# Patient Record
Sex: Female | Born: 1970 | Race: White | Hispanic: No | Marital: Married | Smoking: Never smoker
Health system: Southern US, Community
[De-identification: ages and names within clinical notes are randomized; demographics above are authoritative.]

## PROBLEM LIST (undated history)

## (undated) DIAGNOSIS — I10 Essential (primary) hypertension: Secondary | ICD-10-CM

## (undated) DIAGNOSIS — J45909 Unspecified asthma, uncomplicated: Secondary | ICD-10-CM

---

## 2015-02-23 ENCOUNTER — Emergency Department (HOSPITAL_COMMUNITY): Payer: Self-pay

## 2015-02-23 ENCOUNTER — Encounter (HOSPITAL_COMMUNITY): Payer: Self-pay

## 2015-02-23 ENCOUNTER — Emergency Department (HOSPITAL_COMMUNITY)
Admission: EM | Admit: 2015-02-23 | Discharge: 2015-02-24 | Disposition: A | Discharge status: Home / Self Care | Payer: Self-pay | Attending: Emergency Medicine | Admitting: Emergency Medicine

## 2015-02-23 DIAGNOSIS — W19XXXA Unspecified fall, initial encounter: Secondary | ICD-10-CM

## 2015-02-23 DIAGNOSIS — Y9389 Activity, other specified: Secondary | ICD-10-CM | POA: Insufficient documentation

## 2015-02-23 DIAGNOSIS — Z91040 Latex allergy status: Secondary | ICD-10-CM | POA: Insufficient documentation

## 2015-02-23 DIAGNOSIS — Y998 Other external cause status: Secondary | ICD-10-CM | POA: Insufficient documentation

## 2015-02-23 DIAGNOSIS — S8992XA Unspecified injury of left lower leg, initial encounter: Secondary | ICD-10-CM | POA: Insufficient documentation

## 2015-02-23 DIAGNOSIS — Y92000 Kitchen of unspecified non-institutional (private) residence as  the place of occurrence of the external cause: Secondary | ICD-10-CM | POA: Insufficient documentation

## 2015-02-23 DIAGNOSIS — I10 Essential (primary) hypertension: Secondary | ICD-10-CM | POA: Insufficient documentation

## 2015-02-23 DIAGNOSIS — S0083XA Contusion of other part of head, initial encounter: Secondary | ICD-10-CM | POA: Insufficient documentation

## 2015-02-23 DIAGNOSIS — W01198A Fall on same level from slipping, tripping and stumbling with subsequent striking against other object, initial encounter: Secondary | ICD-10-CM | POA: Insufficient documentation

## 2015-02-23 DIAGNOSIS — M25562 Pain in left knee: Secondary | ICD-10-CM

## 2015-02-23 DIAGNOSIS — J45909 Unspecified asthma, uncomplicated: Secondary | ICD-10-CM | POA: Insufficient documentation

## 2015-02-23 HISTORY — DX: Unspecified asthma, uncomplicated: J45.909

## 2015-02-23 HISTORY — DX: Essential (primary) hypertension: I10

## 2015-02-23 MED ORDER — ACETAMINOPHEN 325 MG PO TABS
650.0000 mg | ORAL_TABLET | Freq: Once | ORAL | Status: AC
  Administered 2015-02-24: 650 mg via ORAL
  Filled 2015-02-23: qty 2

## 2015-02-23 MED ORDER — ACETAMINOPHEN 500 MG PO TABS: 500.0000 mg | ORAL_TABLET | Freq: Four times a day (QID) | ORAL | Status: AC | PRN

## 2015-02-23 MED ORDER — IBUPROFEN 800 MG PO TABS: 800.0000 mg | ORAL_TABLET | Freq: Three times a day (TID) | ORAL | Status: AC

## 2015-02-23 NOTE — Discharge Instructions (Signed)

## 2015-02-23 NOTE — ED Provider Notes (Signed)
CSN: [id number]     Arrival date & time 02/23/15  2156 History  By signing my name below, I, Emmanuella Mensah, attest that this documentation has been prepared under the direction and in the presence of Cheri Fowler, PA-C. Electronically Signed: Angelene Giovanni, ED Scribe. 02/23/2015. 10:54 PM.    Chief Complaint  Patient presents with  . Fall   The history is provided by the patient. No language interpreter was used.   HPI Comments: Cynthia Bass is a 45 y.o. female who presents to the Emergency Department complaining of gradually worsening left knee pain s/p fall that occurred when she tripped over a cartoon of milk as she was carrying a tray of plates. She reports associated nausea and difficulty ambulating secondary to pain. She explains that she fell straight on her knees, hitting her head on shelf in front of her on the way down. She denies any LOC. Pt took 1000 mg Ibuprofen with no relief. She denies any fever, vomiting, visual disturbance, AMS,  numbness/tingling, or weakness.   Past Medical History  Diagnosis Date  . Hypertension   . Asthma    History reviewed. No pertinent past surgical history. No family history on file. Social History  Substance Use Topics  . Smoking status: Never Smoker   . Smokeless tobacco: None  . Alcohol Use: No   OB History    No data available     Review of Systems  Constitutional: Negative for fever.  Gastrointestinal: Positive for nausea. Negative for vomiting.  Musculoskeletal: Positive for arthralgias (left knee).  Neurological: Negative for weakness and numbness.  All other systems reviewed and are negative.     Allergies  Cephalosporins; Latex; Lopurin; and Sulfa antibiotics  Home Medications   Prior to Admission medications   Medication Sig Start Date End Date Taking? Authorizing Provider  acetaminophen (TYLENOL) 500 MG tablet Take 1 tablet (500 mg total) by mouth every 6 (six) hours as needed. 02/23/15   Cheri Fowler, PA-C   ibuprofen (ADVIL,MOTRIN) 800 MG tablet Take 1 tablet (800 mg total) by mouth 3 (three) times daily. 02/23/15   Amrie Gurganus, PA-C   BP 129/79 mmHg  Pulse 64  Temp(Src) 98.2 F (36.8 C) (Oral)  Resp 18  Ht 5' 0.5" (1.537 m)  Wt 90.719 kg  BMI 38.40 kg/m2  SpO2 99%  LMP 01/21/2015 (Approximate) Physical Exam  Constitutional: She is oriented to person, place, and time. She appears well-developed and well-nourished.  HENT:  Head: Normocephalic. Head is with contusion (mild). Head is without raccoon's eyes, without Battle's sign, without abrasion and without laceration.    Nose: Nose normal.  Mouth/Throat: Uvula is midline, oropharynx is clear and moist and mucous membranes are normal.  Eyes: Conjunctivae are normal. Pupils are equal, round, and reactive to light.  Neck: Normal range of motion. Neck supple.  No cervical midline tenderness.   Cardiovascular: Normal rate, regular rhythm and normal heart sounds.   No murmur heard. Pulmonary/Chest: Effort normal and breath sounds normal. No accessory muscle usage or stridor. No respiratory distress. She has no wheezes. She has no rhonchi. She has no rales.  Abdominal: Soft. Bowel sounds are normal. She exhibits no distension. There is no tenderness.  Musculoskeletal: She exhibits tenderness.       Left knee: She exhibits decreased range of motion (secondary to pain) and bony tenderness. She exhibits no swelling, no effusion, no ecchymosis, no deformity, no laceration, no erythema, normal alignment, no LCL laxity, normal patellar mobility and no MCL laxity.  Tenderness found. Medial joint line and lateral joint line tenderness noted.  Compartments are soft and compressible.   Lymphadenopathy:    She has no cervical adenopathy.  Neurological: She is alert and oriented to person, place, and time.  Speech clear without dysarthria.  Cranial nerves grossly intact. RAMs intact.  Strength and sensation intact bilaterally throughout lower  extremities. Right knee: TTP along anterior joint line.  No appreciable swelling or ecchymosis.  No ligamentous laxity.  Negative anterior/posterior drawer.  No pain with varus/valgus maneuver.   Skin: Skin is warm and dry.  Psychiatric: She has a normal mood and affect. Her behavior is normal.  Nursing note and vitals reviewed.   ED Course  Procedures (including critical care time) DIAGNOSTIC STUDIES: Oxygen Saturation is 99% on RA, normal by my interpretation.    COORDINATION OF CARE: 10:51 PM- Pt advised of plan for treatment and pt agrees. Pt will receive an x-ray and Tylenol.    Imaging Review Dg Knee Complete 4 Views Left  02/23/2015  CLINICAL DATA:  LEFT knee pain and edema, after fall at 7:30 p.m. EXAM: LEFT KNEE - COMPLETE 4+ VIEW COMPARISON:  None. FINDINGS: No acute fracture deformity or dislocation. Mild tricompartmental marginal spurring compatible with early osteoarthrosis. Joint space intact without erosions. No destructive bony lesions. Soft tissue planes are not suspicious. IMPRESSION: No acute fracture deformity or dislocation. Mild tricompartmental osteoarthrosis. Electronically Signed   By: Awilda Metroourtnay  Bloomer M.D.   On: 02/23/2015 23:22     Cheri FowlerKayla Keland Peyton, PA-C has personally reviewed and evaluated these images as part of her medical decision-making.   MDM  Patient X-Ray negative for obvious fracture or dislocation. Pain managed in ED. No indication for head CT per [place] head CT rules.  Pt advised to follow up with PCP if symptoms persist for possibility of missed fracture diagnosis. Patient given ACE wrap and crutches while in ED, conservative therapy recommended and discussed. Patient will be dc home & is agreeable with above plan.   Final diagnoses:  Fall  Left knee pain   I personally performed the services described in this documentation, which was scribed in my presence. The recorded information has been reviewed and is accurate.   Cheri FowlerKayla Riniyah Speich, PA-C 02/24/15  0001  Gerhard Munchobert Lockwood, MD 02/24/15 0010

## 2015-02-23 NOTE — ED Notes (Signed)
Pt states she tripped and fell on the floor in the kitchen; pt denies LOC but states she hit head; pt states pain in left knee at 8/10; pt a&o x 4 on arrival ; Pt states 1000 mg of Advil she took has not helped; Pt states she has been unable to walk on leg.

## 2015-07-15 ENCOUNTER — Other Ambulatory Visit: Payer: Self-pay | Admitting: Specialist

## 2015-07-15 DIAGNOSIS — M25571 Pain in right ankle and joints of right foot: Secondary | ICD-10-CM

## 2015-07-18 ENCOUNTER — Other Ambulatory Visit: Payer: Self-pay

## 2015-07-26 ENCOUNTER — Ambulatory Visit
Admission: RE | Admit: 2015-07-26 | Discharge: 2015-07-26 | Disposition: A | Discharge status: Home / Self Care | Payer: Self-pay | Source: Ambulatory Visit | Attending: Specialist | Admitting: Specialist

## 2015-07-26 DIAGNOSIS — M25571 Pain in right ankle and joints of right foot: Secondary | ICD-10-CM

## 2017-12-20 IMAGING — MR MR ANKLE*R* W/O CM
4 of 6 series · 14 of 40 positions shown · non-contrast
Comparison: None.

CLINICAL DATA: Right ankle pain in weakness. Fall with twisting
ankle injury about 10 weeks ago.

EXAM:
MRI OF THE RIGHT ANKLE WITHOUT CONTRAST
TECHNIQUE: Multiplanar, multisequence MR imaging of the ankle was performed. No
intravenous contrast was administered.

[Series 4: PD fat-sat · axial · 4.0mm · 0.25mm/px · z∈[-57,+45]mm · 5 of 28 slices shown]
[im 1/28]
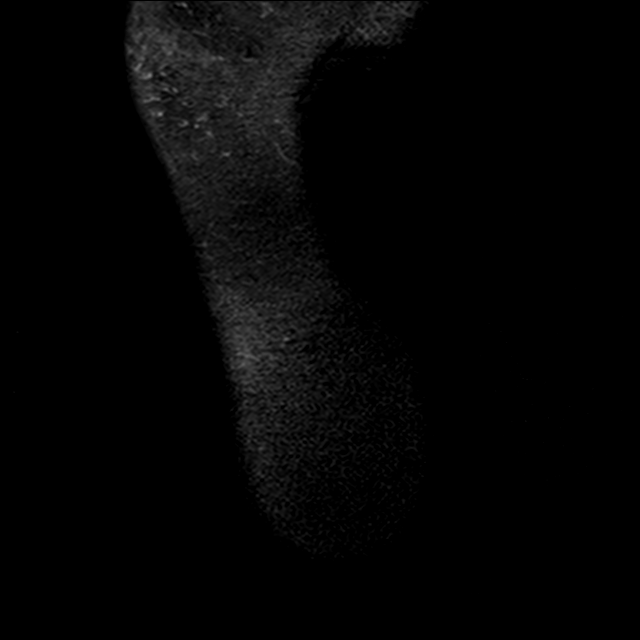
[im 5/28]
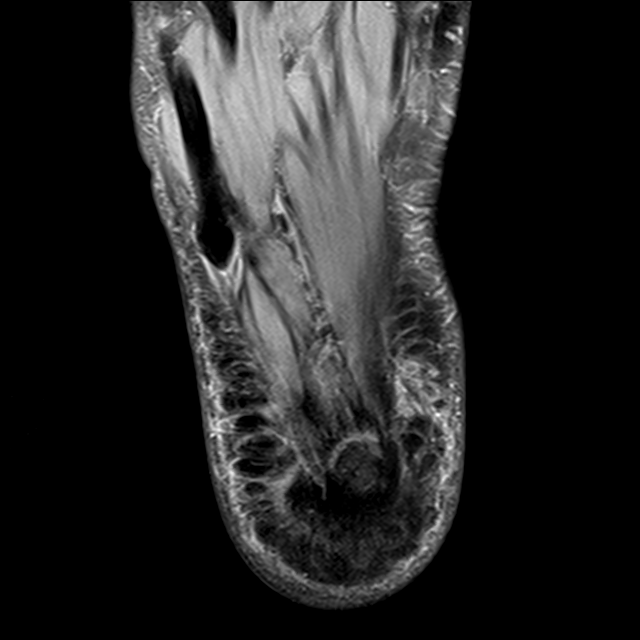
[im 10/28]
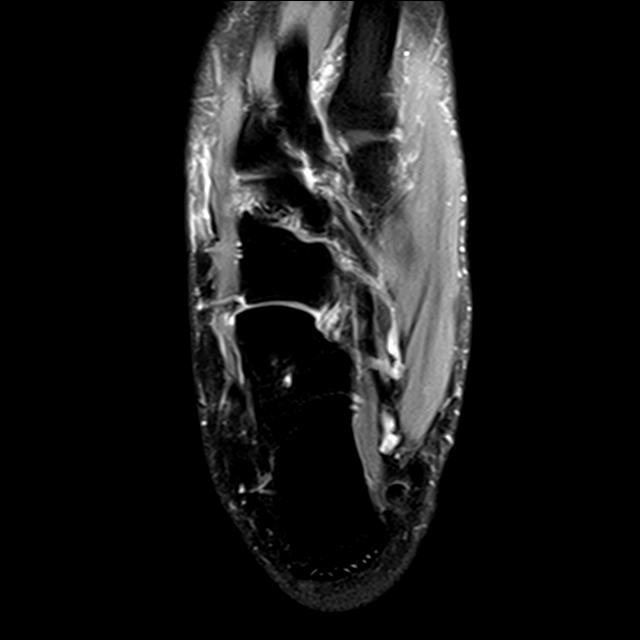
[im 14/28]
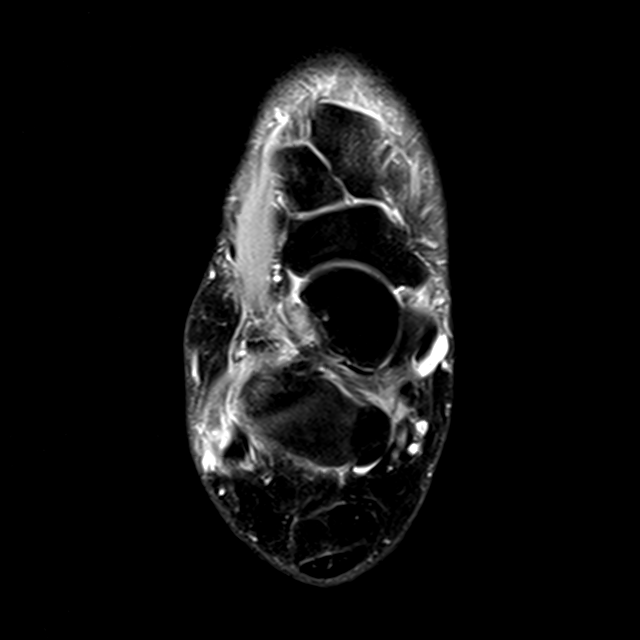
[im 23/28]
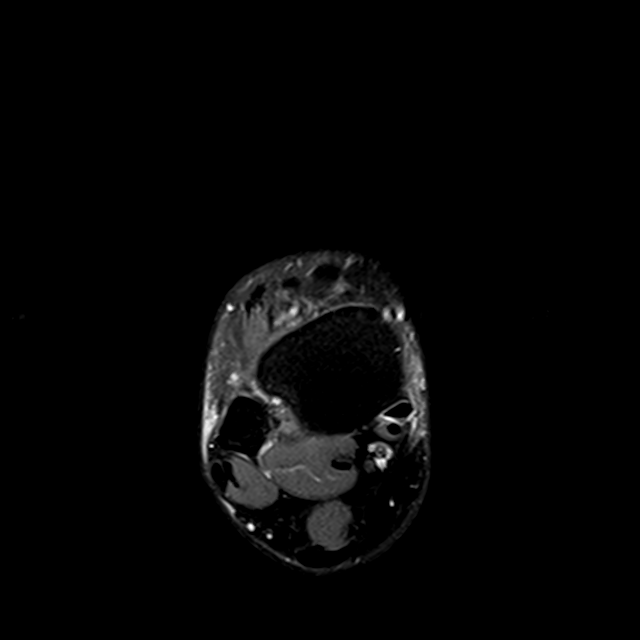

[Series 7: T2 fat-sat · axial · 4.0mm · 0.25mm/px · z∈[-38,+45]mm · 3 of 28 slices shown (1 of 3)]
[im 5/28]
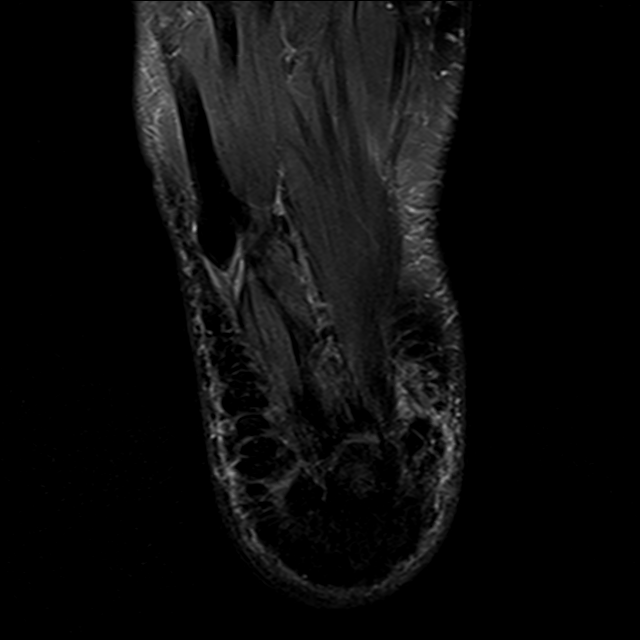
[im 14/28]
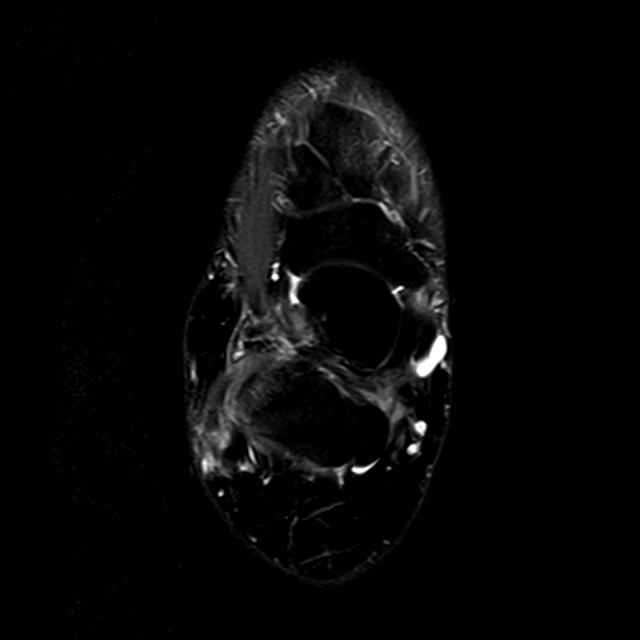
[im 23/28]
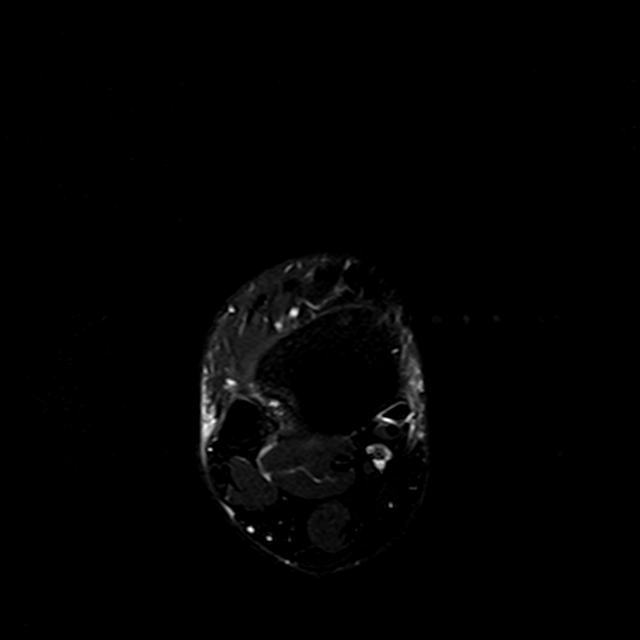

[Series 8: T2 fat-sat · coronal · 3.5mm · 0.31mm/px · 3 of 30 slices shown (2 of 3)]
[im 5/30]
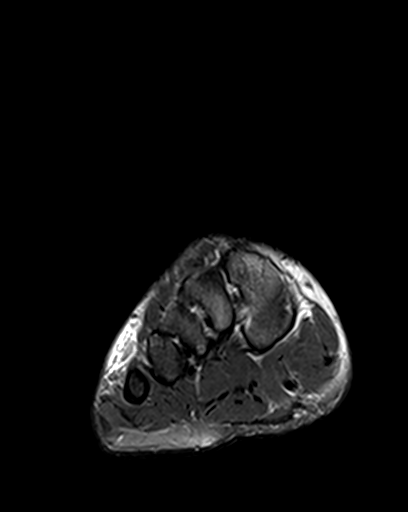
[im 17/30]
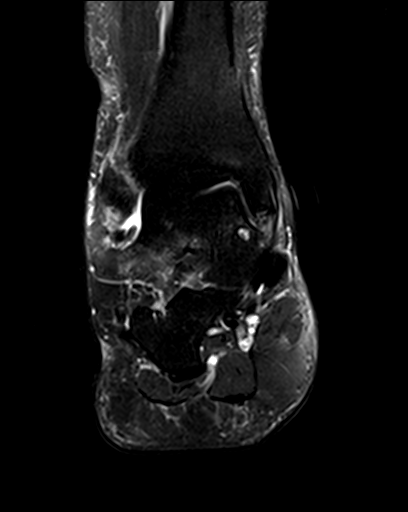
[im 25/30]
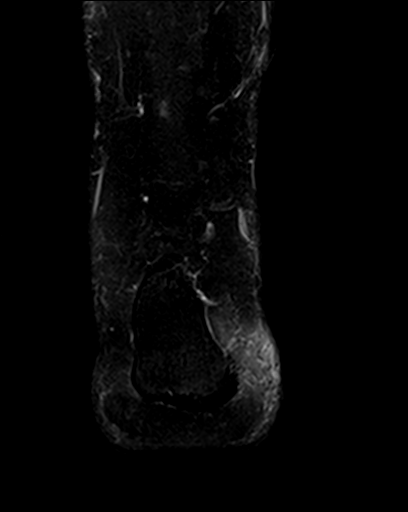

[Series 9: T2 fat-sat · sagittal · 3.0mm · 0.33mm/px · 3 of 21 slices shown (3 of 3)]
[im 1/21]
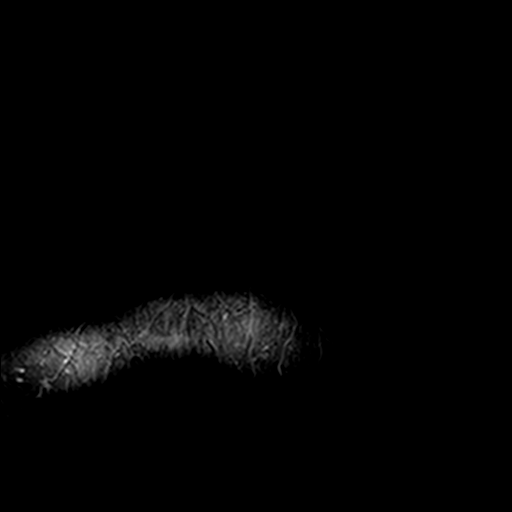
[im 11/21]
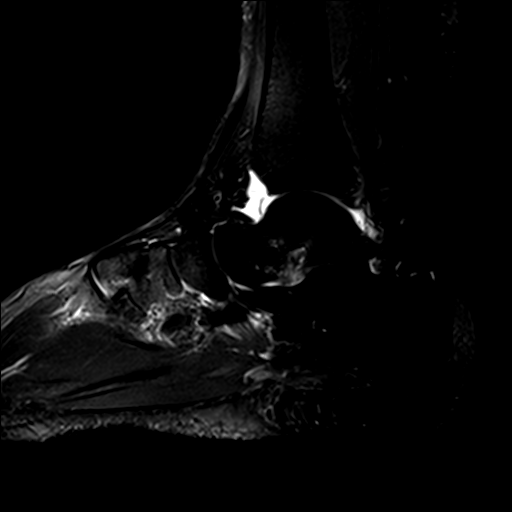
[im 21/21]
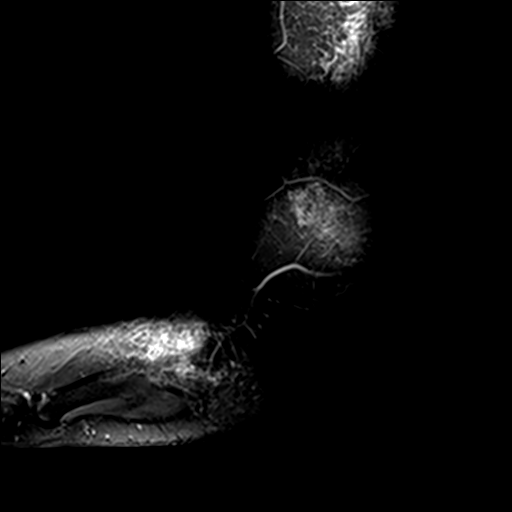

[14 of 40 positions shown; findings below may reference images not displayed]

FINDINGS: TENDONS

Peroneal: Mild common peroneus tendon sheath tenosynovitis.

Posteromedial: Tibialis posterior tenosynovitis and distal
tendinopathy.

Anterior: Unremarkable

Achilles: Unremarkable

Plantar Fascia: Minimal accentuated T2 signal in the medial band of
the plantar fascia.

LIGAMENTS

Lateral: The anterior inferior tibiofibular ligament is not well
seen and is thought to be torn. The posterior inferior tibiofibular
ligament is attenuated although probably at least partially intact
on image [DATE]. Abnormal thickened in indistinct anterior talofibular
ligament, likely from prior tear, with surrounding edema, but no
localized anterolateral synovitis to further suggest anterolateral
impingement. Slight indistinctness the calcaneofibular ligament
proximally.

Medial: Low-level edema and slight disorganization along the deep
tibiotalar component of the deltoid ligament, with adjacent reactive
edema and possible subcortical cyst formation along the talus and
medial malleolus at the attachment sites. Tibiospring and
tibionavicular portions of the deltoid ligament appear intact.
However there is some thickening of the superomedial portion of the
spring ligament. Lisfranc ligament intact.

CARTILAGE

Ankle Joint: Small tibiotalar joint effusion.

Subtalar Joints/Sinus Tarsi: Unremarkable

Bones:  No additional significant bony abnormalities identified.

Other: No supplemental non-categorized findings.
IMPRESSION: 1. Very poor definition of the anterior inferior tibiofibular
ligament, suspicious for tear. Appearance compatible with
syndesmotic injury/ "high ankle sprain ."
2. Sprained anterior talofibular ligament with surrounding edema but
not completely discontinuous. No synovitis in this vicinity to
suggest anterolateral impingement.
3. There is some edema along the deep tibiotalar component of the
deltoid ligament also with reactive edema in the adjacent attachment
site of the medial malleolus and small degenerative subcortical
cystic lesions near the attachment site to the talus. Appearance
suggests sprain or partial tear of the deep tibiotalar portion of
the spring ligament in the past.
4. Distal tibialis posterior tendinopathy with adjacent thickening
of the superomedial portion of the spring ligament. Correlate
clinically in assessing for tibialis posterior dysfunction.
5. Mild common peroneus tendon sheath tenosynovitis.
6. Very low grade plantar fasciitis.
7. Small tibiotalar joint effusion.
